# Patient Record
Sex: Male | Born: 1985 | Race: White | Hispanic: Yes | Marital: Single | Smoking: Never smoker
Health system: Southern US, Community
[De-identification: ages and names within clinical notes are randomized; demographics above are authoritative.]

---

## 2005-12-20 ENCOUNTER — Emergency Department: Payer: Self-pay | Admitting: Emergency Medicine

## 2009-05-12 ENCOUNTER — Emergency Department: Payer: Self-pay | Admitting: Emergency Medicine

## 2010-07-31 ENCOUNTER — Emergency Department: Payer: Self-pay | Admitting: Unknown Physician Specialty

## 2010-11-01 ENCOUNTER — Emergency Department: Payer: Self-pay | Admitting: *Deleted

## 2015-10-06 LAB — HM HIV SCREENING LAB: HM HIV Screening: NEGATIVE

## 2015-10-15 ENCOUNTER — Emergency Department
Admission: EM | Admit: 2015-10-15 | Discharge: 2015-10-15 | Disposition: A | Discharge status: Home / Self Care | Payer: Self-pay | Attending: Emergency Medicine | Admitting: Emergency Medicine

## 2015-10-15 ENCOUNTER — Encounter: Payer: Self-pay | Admitting: Emergency Medicine

## 2015-10-15 ENCOUNTER — Emergency Department: Payer: Self-pay

## 2015-10-15 DIAGNOSIS — Y999 Unspecified external cause status: Secondary | ICD-10-CM | POA: Insufficient documentation

## 2015-10-15 DIAGNOSIS — F1290 Cannabis use, unspecified, uncomplicated: Secondary | ICD-10-CM | POA: Insufficient documentation

## 2015-10-15 DIAGNOSIS — Y939 Activity, unspecified: Secondary | ICD-10-CM | POA: Insufficient documentation

## 2015-10-15 DIAGNOSIS — Y929 Unspecified place or not applicable: Secondary | ICD-10-CM | POA: Insufficient documentation

## 2015-10-15 DIAGNOSIS — G5621 Lesion of ulnar nerve, right upper limb: Secondary | ICD-10-CM | POA: Insufficient documentation

## 2015-10-15 DIAGNOSIS — W1800XA Striking against unspecified object with subsequent fall, initial encounter: Secondary | ICD-10-CM | POA: Insufficient documentation

## 2015-10-15 MED ORDER — PREDNISONE 20 MG PO TABS
60.0000 mg | ORAL_TABLET | Freq: Once | ORAL | Status: AC
  Administered 2015-10-15: 60 mg via ORAL
  Filled 2015-10-15: qty 3

## 2015-10-15 MED ORDER — PREDNISONE 20 MG PO TABS: 40.0000 mg | ORAL_TABLET | Freq: Every day | ORAL | 0 refills | Status: DC

## 2015-10-15 NOTE — ED Provider Notes (Signed)
[facility] Emergency Department Provider Note  Time seen: 1:13 PM  I have reviewed the triage vital signs and the nursing notes.   HISTORY  Chief Complaint Fall and Numbness    HPI Allen Murphy is a 30 y.o. male with no past medical history who presents the emergency department for evaluation of right hand numbness/weakness as well as a head injury with LOC. According to the patient 2 days ago he fell backwards hitting his right elbow and the back of his head. He states brief LOC at that time. Denies nausea or vomiting. Denies any headache currently however he states since that fall he has had weakness and numbness in his fourth and fifth digits of the right hand. Patient denies any pain in the hand. Continues to have moderate pain in the right elbow especially with flexion.  History reviewed. No pertinent past medical history.  There are no active problems to display for this patient.   History reviewed. No pertinent surgical history.  Prior to Admission medications   Not on File    No Known Allergies  No family history on file.  Social History Social History  Substance Use Topics  . Smoking status: Never Smoker  . Smokeless tobacco: Never Used  . Alcohol use Yes     Comment: Rare    Review of Systems Constitutional: Negative for fever Cardiovascular: Negative for chest pain. Respiratory: Negative for shortness of breath. Gastrointestinal: Negative for abdominal pain Musculoskeletal:Right elbow pain. Skin: Negative for rash. Neurological: Negative for . Weakness and numbness in the right fourth and fifth fingers. 10-point ROS otherwise negative.  ____________________________________________   PHYSICAL EXAM:  VITAL SIGNS: ED Triage Vitals  Enc Vitals Group     BP 10/15/15 1132 (!) 142/78     Pulse Rate 10/15/15 1132 81     Resp 10/15/15 1132 18     Temp 10/15/15 1132 98.4 F (36.9 C)     Temp Source 10/15/15 1132 Oral     SpO2  10/15/15 1132 100 %     Weight 10/15/15 1132 130 lb (59 kg)     Height 10/15/15 1132  (1.626 m)     Head Circumference --      Peak Flow --      Pain Score 10/15/15 1133 5     Pain Loc --      Pain Edu? --      Excl. in GC? --     Constitutional: Alert and oriented. Well appearing and in no distress. Eyes: Normal exam ENT   Head: Normocephalic and atraumatic.   Mouth/Throat: Mucous membranes are moist. Cardiovascular: Normal rate, regular rhythm. No murmur Respiratory: Normal respiratory effort without tachypnea nor retractions. Breath sounds are clear  Gastrointestinal: Soft and nontender. No distention.   Musculoskeletal: Moderate tenderness to palpation and mild edema noted in the right elbow especially the medial aspect. Very tender to palpation over the ulnar nerve in the elbow. No tenderness palpation of the wrist or hand. Neurologic:  Normal speech and language. Patient does have subjective decreased sensation in the right fourth and fifth digits with decreased ability to flex and extend the right fourth and fifth digits. Skin:  Skin is warm, dry and intact.  Psychiatric: Mood and affect are normal. Speech and behavior are normal.   ____________________________________________   RADIOLOGY  Elbow x-rays negative. CT scan of the head is negative.  ____________________________________________   INITIAL IMPRESSION / ASSESSMENT AND PLAN / ED COURSE  Pertinent labs &  imaging results that were available during my care of the patient were reviewed by me and considered in my medical decision making (see chart for details).  Patient presents emergency Department with continued numbness and weakness of the right fourth and fifth digits after a fall hitting his elbow and head 2 days ago. Patient CT scan the head is normal. 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 Given the edema and tenderness of the right elbow we will place the patient on a brief course of steroids, have him follow-up with orthopedics in 1 week for repeat evaluation. The patient is agreeable to plan.  ____________________________________________   FINAL CLINICAL IMPRESSION(S) / ED DIAGNOSES  Ulnar neuropathy    Minna Antis, MD 10/15/15 1316

## 2015-10-15 NOTE — ED Triage Notes (Signed)
Pt states fell on Friday and hit the back of his head, and his elbow. Pt states +LOC at this time. States that he now has numbness to his R hand for his ring and pinky fingers. Pt states grip strength is weak , weak grip strength noted to R hand. Pt is alert and oriented at this time. NAD noted.

## 2016-10-17 IMAGING — CT CT HEAD W/O CM
3 series · 15 of 45 positions shown, 18 images · non-contrast
Comparison: None.

CLINICAL DATA: 30-year-old male with right hand numbness and
weakness following fall, head injury and loss of consciousness 2
days ago. Initial encounter.

EXAM:
CT HEAD WITHOUT CONTRAST
TECHNIQUE: Contiguous axial images were obtained from the base of the skull
through the vertex without intravenous contrast.

[Series 2: head wo · axial · 0.39mm/px · z∈[-2,+113]mm · 9 of 28 slices shown, 12 images]
[im 3/28  brain]
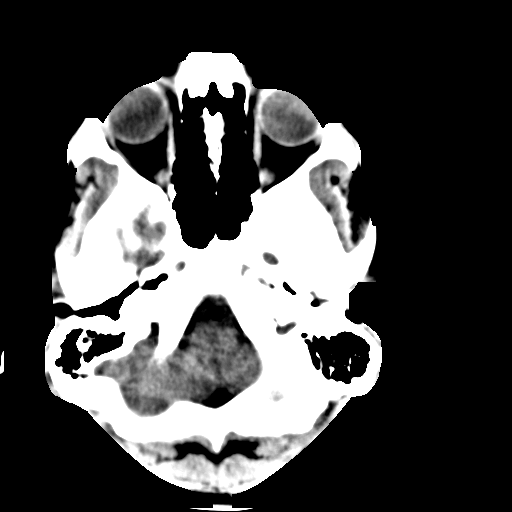
[im 3/28  bone]
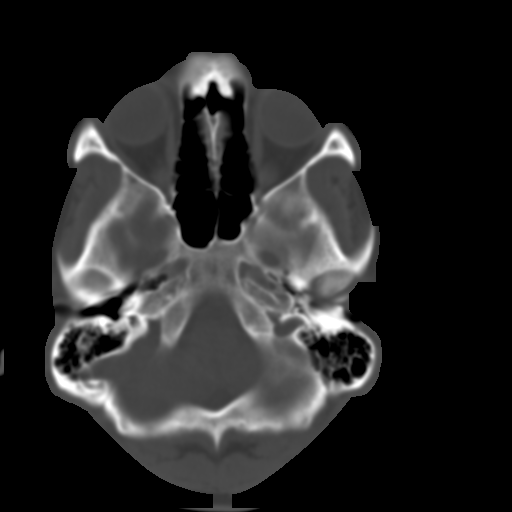
[im 6/28  brain]
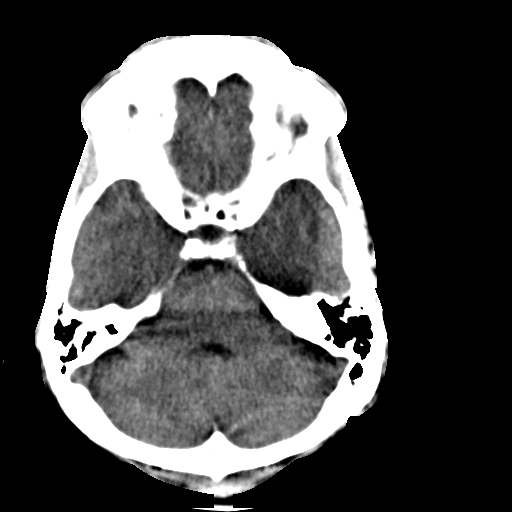
[im 9/28  brain]
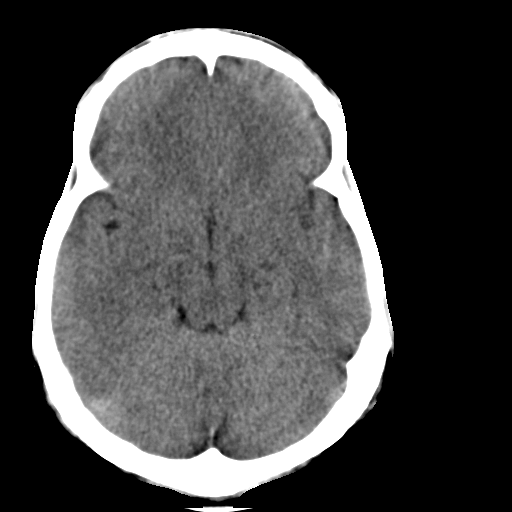
[im 12/28  brain]
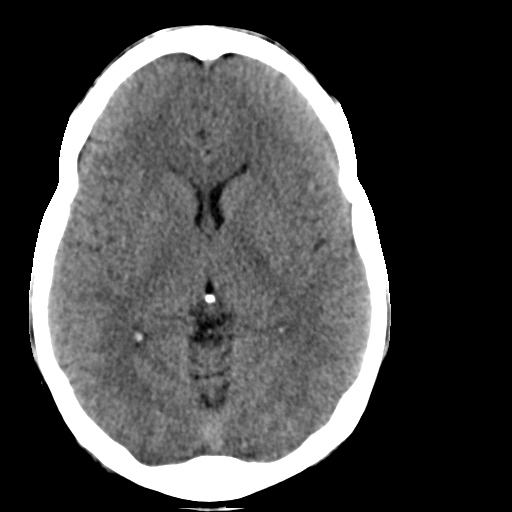
[im 15/28  brain]
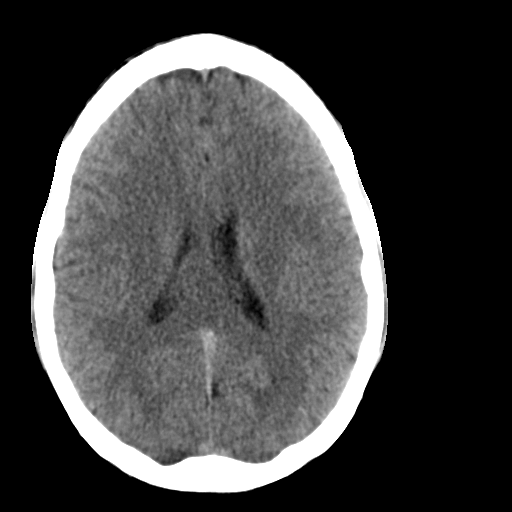
[im 15/28  bone]
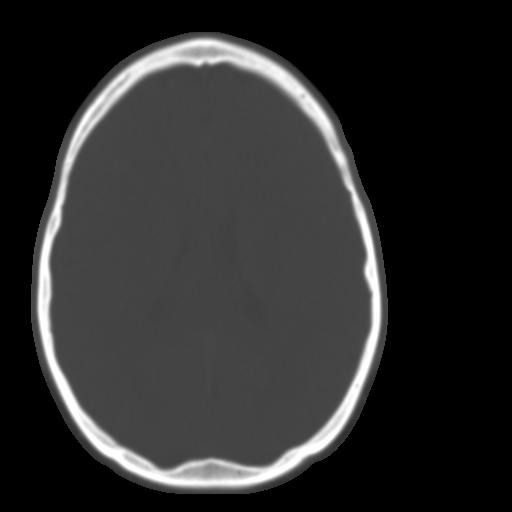
[im 17/28  brain]
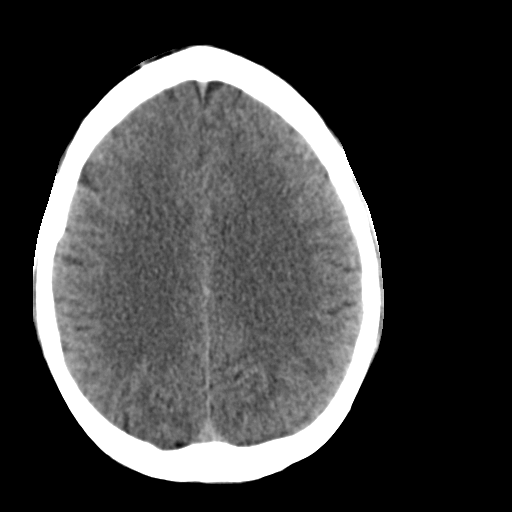
[im 20/28  brain]
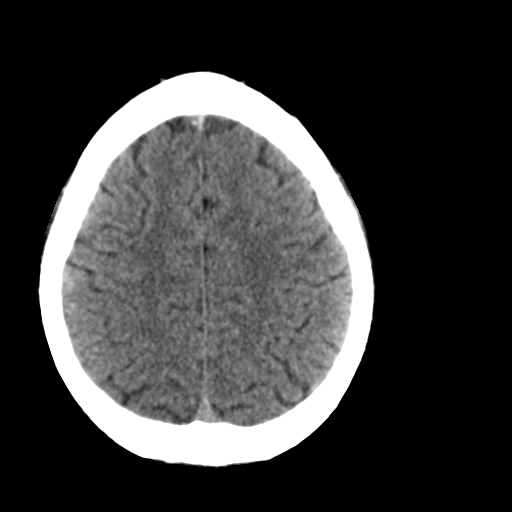
[im 23/28  brain]
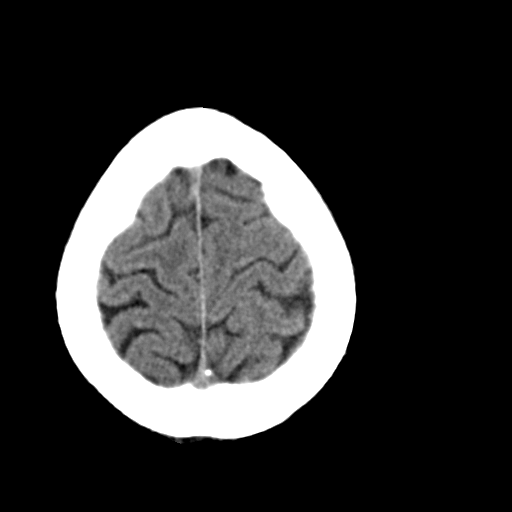
[im 26/28  brain]
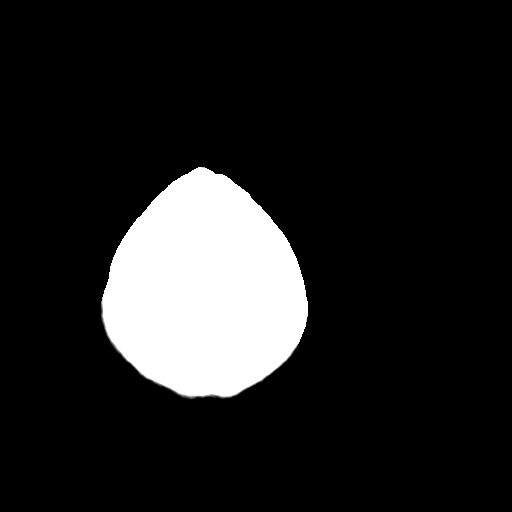
[im 26/28  bone]
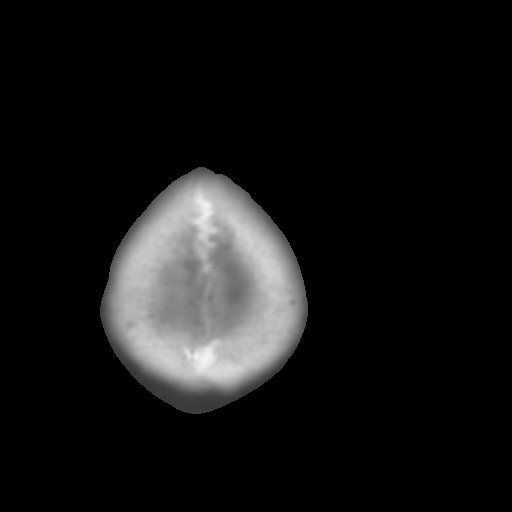

[Series 4: coronal soft tissue · coronal · 0.32mm/px · 3 of 62 slices shown]
[im 21/62  brain]
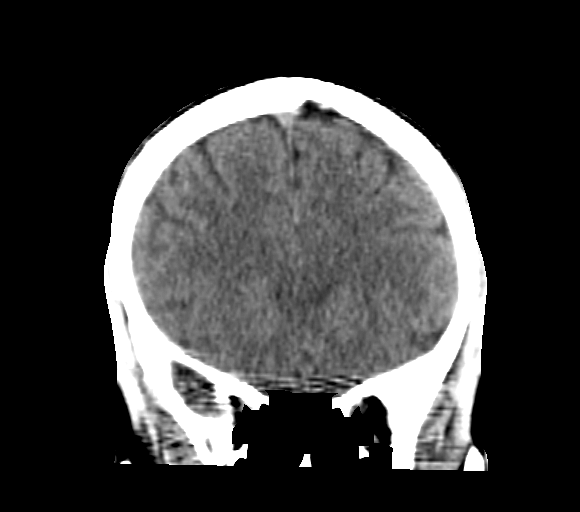
[im 28/62  brain]
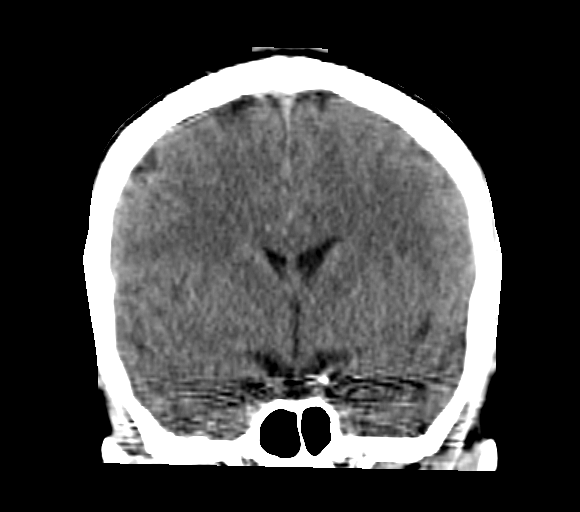
[im 34/62  brain]
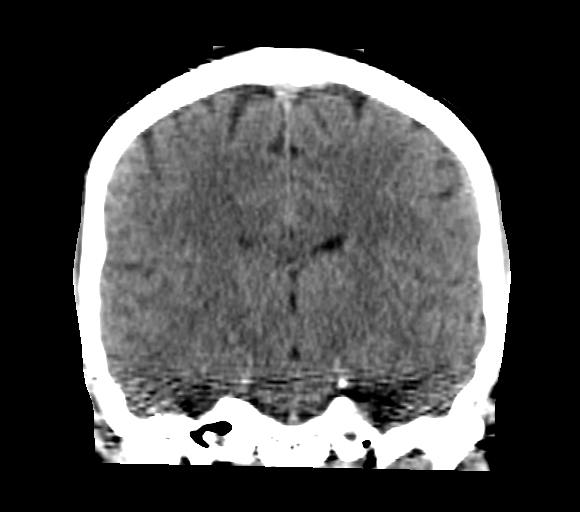

[Series 5: sagittal soft tissue · sagittal · 0.33mm/px · 3 of 52 slices shown]
[im 18/52  brain]
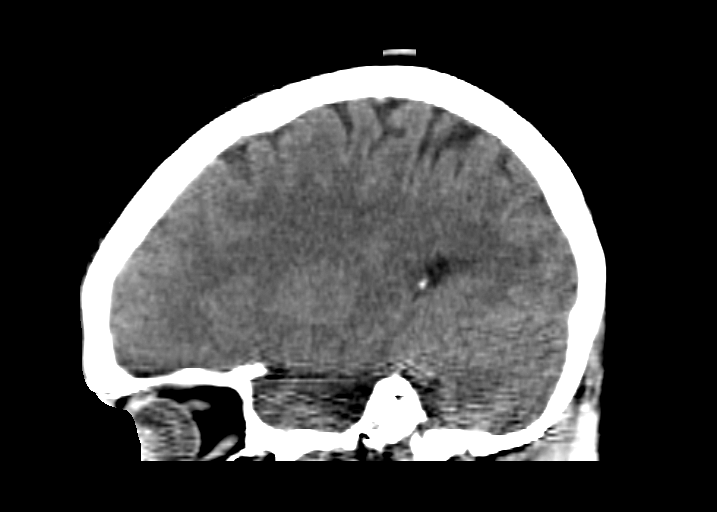
[im 26/52  brain]
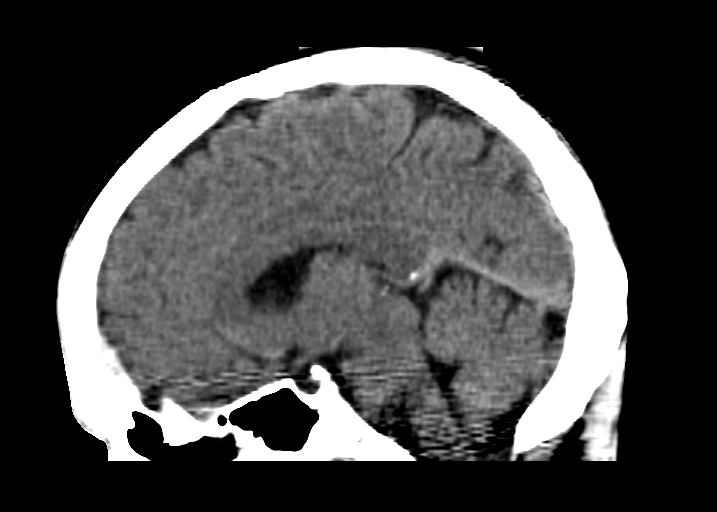
[im 35/52  brain]
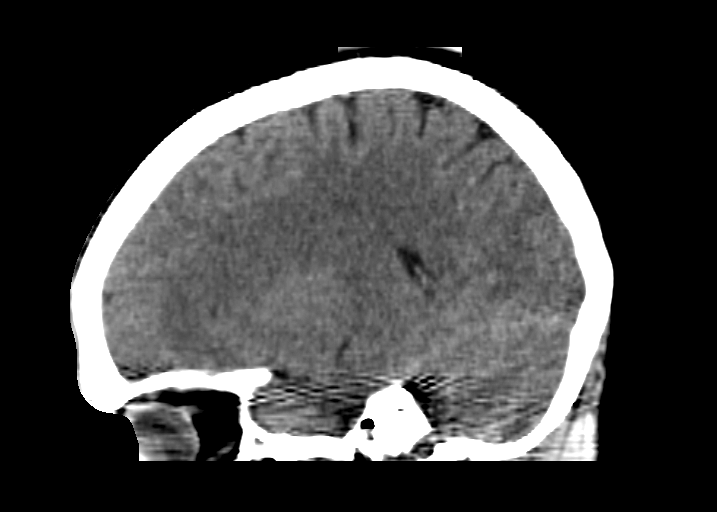

[15 of 45 positions shown; findings below may reference images not displayed]

FINDINGS: No intracranial abnormalities are identified, including mass lesion
or mass effect, hydrocephalus, extra-axial fluid collection, midline
shift, hemorrhage, or acute infarction.

The visualized bony calvarium is unremarkable.
IMPRESSION: Unremarkable noncontrast head CT.

## 2019-04-16 ENCOUNTER — Ambulatory Visit: Payer: Self-pay

## 2019-06-11 ENCOUNTER — Ambulatory Visit: Payer: Self-pay

## 2019-08-20 ENCOUNTER — Ambulatory Visit: Payer: Self-pay | Admitting: Physician Assistant

## 2019-08-20 ENCOUNTER — Encounter: Payer: Self-pay | Admitting: Physician Assistant

## 2019-08-20 ENCOUNTER — Other Ambulatory Visit: Payer: Self-pay

## 2019-08-20 DIAGNOSIS — Z113 Encounter for screening for infections with a predominantly sexual mode of transmission: Secondary | ICD-10-CM

## 2019-08-20 DIAGNOSIS — Z8619 Personal history of other infectious and parasitic diseases: Secondary | ICD-10-CM

## 2019-08-20 DIAGNOSIS — B356 Tinea cruris: Secondary | ICD-10-CM

## 2019-08-20 LAB — GRAM STAIN

## 2019-08-20 MED ORDER — CLOTRIMAZOLE 1 % EX CREA: 1.0000 "application " | TOPICAL_CREAM | Freq: Two times a day (BID) | CUTANEOUS | 0 refills | Status: AC

## 2019-08-20 NOTE — Progress Notes (Signed)
Pt here for STD screening. 

## 2019-08-20 NOTE — Progress Notes (Signed)
Gram stain reviewed and is negative today, so no treatment needed for gram stain per standing order. Pt received Clotrimazole Topical cream per provider order. Provider orders completed.

## 2019-08-21 DIAGNOSIS — Z8619 Personal history of other infectious and parasitic diseases: Secondary | ICD-10-CM | POA: Insufficient documentation

## 2019-08-21 NOTE — Progress Notes (Signed)
[facility] Department STI clinic/screening visit  Subjective:  Allen Murphy is a 34 y.o. male being seen today for an STI screening visit. The patient reports they do have symptoms.    Patient has the following medical conditions:  There are no problems to display for this patient.    Chief Complaint  Patient presents with  . SEXUALLY TRANSMITTED DISEASE    screening    HPI  Patient reports that he is having itching and skin "peeling" on upper inner thigh and on the head of his penis.  States that he has not used any OTC creams or salves on the area.  Denies chronic conditions, surgeries and regular medicines.  Per patient last HIV test was 2019.   See flowsheet for further details and programmatic requirements.    The following portions of the patient's history were reviewed and updated as appropriate: allergies, current medications, past medical history, past social history, past surgical history and problem list.  Objective:  There were no vitals filed for this visit.  Physical Exam Constitutional:      General: He is not in acute distress.    Appearance: Normal appearance.  HENT:     Head: Normocephalic and atraumatic.     Comments: No nits, lice, or hair loss. No cervical, supraclavicular or axillary adenopathy.    Mouth/Throat:     Mouth: Mucous membranes are moist.     Pharynx: Oropharynx is clear. No oropharyngeal exudate or posterior oropharyngeal erythema.  Eyes:     Conjunctiva/sclera: Conjunctivae normal.  Pulmonary:     Effort: Pulmonary effort is normal.  Abdominal:     Palpations: Abdomen is soft. There is no mass.     Tenderness: There is no abdominal tenderness. There is no guarding or rebound.  Genitourinary:    Penis: Normal.      Testes: Normal.     Comments: Pubic area without nits, lice, edema, erythema and lesions. Inguinal area without adenopathy but on L>R side, hyperpigmented, scaling area over slight pinkish base with signs  of excoriation. Penis uncircumcised, without ulcerative lesions and discharge from meatus.  Head of penis with slight grayish scaling over pinkish petechiae. Musculoskeletal:     Cervical back: Neck supple. No tenderness.  Skin:    General: Skin is warm and dry.     Findings: No bruising, erythema, lesion or rash.  Neurological:     Mental Status: He is alert and oriented to person, place, and time.  Psychiatric:        Mood and Affect: Mood normal.        Behavior: Behavior normal.        Thought Content: Thought content normal.        Judgment: Judgment normal.       Assessment and Plan:  Allen Murphy is a 34 y.o. male presenting to the [facility] Department for STI screening  1. Screening for STD (sexually transmitted disease) Patient into clinic with symptoms.  Declines HIV testing today. Rec condoms with all sex. Await test results.  Counseled that RN will call if needs to RTC for further treatment once results are back.  - Gram stain - Gonococcus culture - Syphilis Serology, [facility]  2. Tinea cruris Counseled patient on steps to reduce moisture in inguinal/pubic area to prevent fungal infections. Use Clotrimazole 1% cream BID for 14 days and if needed can get OTC supply to finish treatment. - clotrimazole (CLOTRIMAZOLE ANTI-FUNGAL) 1 % cream; Apply 1 application  topically 2 (two) times daily for 14 days.  Dispense: 30 g; Refill: 0     Return if symptoms worsen or fail to improve.  No future appointments.  Jerene Dilling, PA

## 2019-08-25 LAB — GONOCOCCUS CULTURE

## 2021-11-11 ENCOUNTER — Encounter: Payer: Self-pay | Admitting: Intensive Care

## 2021-11-11 ENCOUNTER — Emergency Department
Admission: EM | Admit: 2021-11-11 | Discharge: 2021-11-11 | Disposition: A | Discharge status: Home / Self Care | Payer: Self-pay | Attending: Emergency Medicine | Admitting: Emergency Medicine

## 2021-11-11 ENCOUNTER — Ambulatory Visit
Admission: EM | Admit: 2021-11-11 | Discharge: 2021-11-11 | Disposition: A | Discharge status: Home / Self Care | Payer: Self-pay | Attending: Emergency Medicine | Admitting: Emergency Medicine

## 2021-11-11 ENCOUNTER — Encounter: Payer: Self-pay | Admitting: Emergency Medicine

## 2021-11-11 ENCOUNTER — Emergency Department: Payer: Self-pay

## 2021-11-11 ENCOUNTER — Other Ambulatory Visit: Payer: Self-pay

## 2021-11-11 DIAGNOSIS — N451 Epididymitis: Secondary | ICD-10-CM | POA: Insufficient documentation

## 2021-11-11 DIAGNOSIS — R1031 Right lower quadrant pain: Secondary | ICD-10-CM | POA: Insufficient documentation

## 2021-11-11 DIAGNOSIS — N50811 Right testicular pain: Secondary | ICD-10-CM | POA: Insufficient documentation

## 2021-11-11 LAB — URINALYSIS, MICROSCOPIC (REFLEX): Squamous Epithelial / HPF: NONE SEEN (ref 0–5)

## 2021-11-11 LAB — CBC
HCT: 43.5 % (ref 39.0–52.0)
Hemoglobin: 13.6 g/dL (ref 13.0–17.0)
MCH: 28.6 pg (ref 26.0–34.0)
MCHC: 31.3 g/dL (ref 30.0–36.0)
MCV: 91.4 fL (ref 80.0–100.0)
Platelets: 316 10*3/uL (ref 150–400)
RBC: 4.76 MIL/uL (ref 4.22–5.81)
RDW: 11.5 % (ref 11.5–15.5)
WBC: 8.9 10*3/uL (ref 4.0–10.5)
nRBC: 0 % (ref 0.0–0.2)

## 2021-11-11 LAB — COMPREHENSIVE METABOLIC PANEL
ALT: 25 U/L (ref 0–44)
AST: 23 U/L (ref 15–41)
Albumin: 4.3 g/dL (ref 3.5–5.0)
Alkaline Phosphatase: 73 U/L (ref 38–126)
Anion gap: 5 (ref 5–15)
BUN: 18 mg/dL (ref 6–20)
CO2: 28 mmol/L (ref 22–32)
Calcium: 9.2 mg/dL (ref 8.9–10.3)
Chloride: 106 mmol/L (ref 98–111)
Creatinine, Ser: 0.81 mg/dL (ref 0.61–1.24)
GFR, Estimated: >60 mL/min (ref >60)
Glucose, Bld: 105 mg/dL — ABNORMAL HIGH (ref 70–99)
Potassium: 4.4 mmol/L (ref 3.5–5.1)
Sodium: 139 mmol/L (ref 135–145)
Total Bilirubin: 0.8 mg/dL (ref 0.3–1.2)
Total Protein: 7.4 g/dL (ref 6.5–8.1)

## 2021-11-11 LAB — URINALYSIS, ROUTINE W REFLEX MICROSCOPIC
Bilirubin Urine: NEGATIVE
Bilirubin Urine: NEGATIVE
Glucose, UA: NEGATIVE mg/dL
Glucose, UA: NEGATIVE mg/dL
Ketones, ur: NEGATIVE mg/dL
Ketones, ur: 5 mg/dL — AB
Leukocytes,Ua: NEGATIVE
Leukocytes,Ua: NEGATIVE
Nitrite: NEGATIVE
Nitrite: NEGATIVE
Protein, ur: NEGATIVE mg/dL
Specific Gravity, Urine: 1.025 (ref 1.005–1.030)
Specific Gravity, Urine: 1.02 (ref 1.005–1.030)
pH: 6.5 (ref 5.0–8.0)
pH: 6 (ref 5.0–8.0)

## 2021-11-11 LAB — LIPASE, BLOOD: Lipase: 27 U/L (ref 11–51)

## 2021-11-11 MED ORDER — CEFTRIAXONE SODIUM 1 G IJ SOLR
500.0000 mg | Freq: Once | INTRAMUSCULAR | Status: AC
  Administered 2021-11-11: 500 mg via INTRAMUSCULAR
  Filled 2021-11-11: qty 10

## 2021-11-11 MED ORDER — DOXYCYCLINE HYCLATE 100 MG PO TABS: 100.0000 mg | ORAL_TABLET | Freq: Two times a day (BID) | ORAL | 0 refills | Status: AC

## 2021-11-11 NOTE — ED Triage Notes (Signed)
Patient reports abdominal pain that starts at his belly button and goes dow his abdomen that started last Sunday.  Patient denies N/V/D.  Patient reports urinary frequency.

## 2021-11-11 NOTE — ED Provider Notes (Signed)
[facility]    CSN: [id number] Arrival date & time: 11/11/21  1331      History   Chief Complaint Chief Complaint  Patient presents with   Abdominal Pain    HPI Allen Murphy is a 36 y.o. male.   HPI  36 year old male here for evaluation of abdominal complaints.  Patient reports that for the last week he has been experiencing pain on the right lower portion of his abdomen.  He is also been experiencing pain in his right testicle and states that it is swollen and higher than it typically is.  He states he feels like it shifting around in side of his scrotum.  This is associated with discomfort with urination which she describes as an uncomfortable pressure and urinary urgency and frequency.  He denies any fever, nausea, or vomiting.  Patient is walking with a limp secondary to pain in his right testicle.  History reviewed. No pertinent past medical history.  Patient Active Problem List   Diagnosis Date Noted   History of syphilis 08/21/2019    History reviewed. No pertinent surgical history.     Home Medications    Prior to Admission medications   Medication Sig Start Date End Date Taking? Authorizing Provider  predniSONE (DELTASONE) 20 MG tablet Take 2 tablets (40 mg total) by mouth daily. 10/15/15   Minna Antis, MD    Family History History reviewed. No pertinent family history.  Social History Social History   Tobacco Use   Smoking status: Never   Smokeless tobacco: Never  Vaping Use   Vaping Use: Some days  Substance Use Topics   Alcohol use: Yes    Comment: Rare   Drug use: Yes    Types: Marijuana    Comment: 1-2 times/day     Allergies   Patient has no known allergies.   Review of Systems Review of Systems  Constitutional:  Negative for fever.  Gastrointestinal:  Positive for abdominal pain. Negative for nausea and vomiting.  Genitourinary:  Positive for dysuria, frequency, testicular pain and urgency.  Skin:  Negative for  rash.  Hematological: Negative.   Psychiatric/Behavioral: Negative.       Physical Exam Triage Vital Signs ED Triage Vitals  Enc Vitals Group     BP 11/11/21 1339 (!) 141/88     Pulse Rate 11/11/21 1339 86     Resp 11/11/21 1339 15     Temp 11/11/21 1339 98.2 F (36.8 C)     Temp Source 11/11/21 1339 Oral     SpO2 11/11/21 1339 98 %     Weight 11/11/21 1337 140 lb (63.5 kg)     Height 11/11/21 1337 5\' 4"  (1.626 m)     Head Circumference --      Peak Flow --      Pain Score 11/11/21 1337 5     Pain Loc --      Pain Edu? --      Excl. in GC? --    No data found.  Updated Vital Signs BP (!) 141/88 (BP Location: Left Arm)   Pulse 86   Temp 98.2 F (36.8 C) (Oral)   Resp 15   Ht 5\' 4"  (1.626 m)   Wt 140 lb (63.5 kg)   SpO2 98%   BMI 24.03 kg/m   Visual Acuity Right Eye Distance:   Left Eye Distance:   Bilateral Distance:    Right Eye Near:   Left Eye Near:    Bilateral Near:  Physical Exam Vitals and nursing note reviewed.  Constitutional:      General: He is in acute distress.     Appearance: Normal appearance.  HENT:     Head: Normocephalic and atraumatic.  Cardiovascular:     Rate and Rhythm: Normal rate and regular rhythm.     Pulses: Normal pulses.     Heart sounds: Normal heart sounds. No murmur heard.    No friction rub. No gallop.  Pulmonary:     Effort: Pulmonary effort is normal.     Breath sounds: Normal breath sounds. No wheezing, rhonchi or rales.  Abdominal:     General: Abdomen is flat.     Palpations: Abdomen is soft.     Tenderness: There is abdominal tenderness. There is guarding. There is no right CVA tenderness, left CVA tenderness or rebound.  Genitourinary:    Penis: Normal.   Neurological:     Mental Status: He is alert.      UC Treatments / Results  Labs (all labs ordered are listed, but only abnormal results are displayed) Labs Reviewed  URINALYSIS, ROUTINE W REFLEX MICROSCOPIC - Abnormal; Notable for the following  components:      Result Value   Hgb urine dipstick TRACE (*)    Protein, ur TRACE (*)    All other components within normal limits  URINALYSIS, MICROSCOPIC (REFLEX) - Abnormal; Notable for the following components:   Bacteria, UA RARE (*)    All other components within normal limits    EKG   Radiology No results found.  Procedures Procedures (including critical care time)  Medications Ordered in UC Medications - No data to display  Initial Impression / Assessment and Plan / UC Course  I have reviewed the triage vital signs and the nursing notes.  Pertinent labs & imaging results that were available during my care of the patient were reviewed by me and considered in my medical decision making (see chart for details).   Patient is a 36 year old male in a moderate degree of pain here for evaluation of pain, swelling, and elevation of his right testicle, right lower quadrant abdominal pain, and urinary urgency, frequency, and pain.  He denies burning with urination but states that the pressure is uncomfortable when he urinates.  All of his symptoms began 1 week ago.  He states he read online that masturbation may help his symptoms so he tried that with mild relief but then his symptoms returned.  He has not had any fever, nausea, or vomiting.  He is walking with a limp secondary to the discomfort in his right testicle.  On exam patient has S1-S2 heart sounds with regular rate and rhythm and lung sounds that are clear to auscultation in all fields.  No CVA tenderness on exam.  Abdomen is soft and flat with right lower quadrant tenderness and guarding.  Genital exam reveals an edematous and tender right testicle.  The testicle does appear to be elevated closer to the perineal wall than the left.  Patient states that he noticed that and that is concerning for him.  He does state that he feels like his testicle shifting around inside of his scrotum.  There is also some swelling to the inguinal  region of the groin without any definitive hernia being palpated.  There is no indirect hernia on exam.  The left testicle is unremarkable.  There is no discharge from the urethral meatus and there is no lesions or rashes on the shaft of the  penis.  Patient's exam is concerning for possible appendicitis as well as possible testicular torsion.  Lower on the differential is a urinary tract infection or epididymitis.  Advised the patient that he needs an ultrasound of his scrotum and possibly CT scan of his abdomen to rule out the 2 most concerning possibilities which are appendicitis and testicular torsion.  He has elected to go to [facility] ER via POV.  Urinalysis was collected at triage which shows trace hemoglobin and trace protein but is negative for leukocyte Estrace or nitrites.  Reflex micro shows rare bacteria.   Final Clinical Impressions(s) / UC Diagnoses   Final diagnoses:  Right lower quadrant abdominal pain  Pain in right testicle     Discharge Instructions      As we discussed, your exam is concerning for possible testicular torsion on the right as well as possible appendicitis.  Please go to [facility] ER for evaluation of your symptoms.  Please go now.     ED Prescriptions   None    PDMP not reviewed this encounter.   Becky Augusta, NP 11/11/21 1357

## 2021-11-11 NOTE — Discharge Instructions (Addendum)
As we discussed, your exam is concerning for possible testicular torsion on the right as well as possible appendicitis.  Please go to [facility] ER for evaluation of your symptoms.  Please go now.

## 2021-11-11 NOTE — ED Triage Notes (Signed)
Patient c/o abdomen pain that radiates down to right testicle. Swelling in right testicle. Symptoms started last Sunday 11/04/21. Denies N/V/D. Reports sharp and painful when urinating

## 2021-11-11 NOTE — ED Provider Notes (Signed)
[facility] Provider Note  Patient Contact: 9:20 PM (approximate)   History   Abdominal Pain and Groin Swelling   HPI  Allen Murphy is a 36 y.o. male who presents the emergency department complaining of testicular pain.  Patient states that he had some right abdominal pain earlier this week, it is now moved into the testicles.  Patient denies dysuria, polyuria, hematuria.  No flank pain.  No history of nephrolithiasis.  He has had unprotected sex in the last 3 months but is not concerned for STD.  Patient denies any fevers or chills, nausea vomiting, diarrhea or constipation.     Physical Exam   Triage Vital Signs: ED Triage Vitals  Enc Vitals Group     BP 11/11/21 2059 (!) 150/92     Pulse Rate 11/11/21 2059 77     Resp 11/11/21 2059 18     Temp 11/11/21 2059 98.9 F (37.2 C)     Temp Source 11/11/21 2059 Oral     SpO2 11/11/21 2059 97 %     Weight 11/11/21 1538 140 lb (63.5 kg)     Height 11/11/21 1538 5\' 5"  (1.651 m)     Head Circumference --      Peak Flow --      Pain Score 11/11/21 1538 6     Pain Loc --      Pain Edu? --      Excl. in GC? --     Most recent vital signs: Vitals:   11/11/21 2059  BP: (!) 150/92  Pulse: 77  Resp: 18  Temp: 98.9 F (37.2 C)  SpO2: 97%     General: Alert and in no acute distress.  Cardiovascular:  Good peripheral perfusion Respiratory: Normal respiratory effort without tachypnea or retractions. Lungs CTAB.  Gastrointestinal: Bowel sounds 4 quadrants. Soft and nontender to palpation. No guarding or rigidity. No palpable masses. No distention. No CVA tenderness. Musculoskeletal: Full range of motion to all extremities.  Neurologic:  No gross focal neurologic deficits are appreciated.  Skin:   No rash noted Other:   ED Results / Procedures / Treatments   Labs (all labs ordered are listed, but only abnormal results are displayed) Labs Reviewed  COMPREHENSIVE METABOLIC PANEL - Abnormal; Notable  for the following components:      Result Value   Glucose, Bld 105 (*)    All other components within normal limits  URINALYSIS, ROUTINE W REFLEX MICROSCOPIC - Abnormal; Notable for the following components:   Color, Urine YELLOW (*)    APPearance CLEAR (*)    Hgb urine dipstick SMALL (*)    Ketones, ur 5 (*)    Bacteria, UA RARE (*)    All other components within normal limits  LIPASE, BLOOD  CBC     EKG     RADIOLOGY  I personally viewed, evaluated, and interpreted these images as part of my medical decision making, as well as reviewing the written report by the radiologist.  ED Provider Interpretation: Findings consistent with epididymal cyst.  No torsion  11/13/21 SCROTUM DOPPLER  Result Date: 11/11/2021 CLINICAL DATA:  Bilateral swelling for 1 week EXAM: SCROTAL ULTRASOUND DOPPLER ULTRASOUND OF THE TESTICLES TECHNIQUE: Complete ultrasound examination of the testicles, epididymis, and other scrotal structures was performed. Color and spectral Doppler ultrasound were also utilized to evaluate blood flow to the testicles. COMPARISON:  None Available. FINDINGS: Right testicle Measurements: 4.1 x 2.0 x 3.3 cm. No mass or microlithiasis visualized. Left testicle  Measurements: 5.1 x 2.1 x 3.3 cm. No mass or microlithiasis visualized. Right epididymis:  Normal in size and appearance. Left epididymis: There is a 2.7 x 1.6 x 2.6 cm simple cyst in the region of the left epididymis. Hydrocele:  None visualized. Varicocele:  None visualized. Pulsed Doppler interrogation of both testes demonstrates normal low resistance arterial and venous waveforms bilaterally. IMPRESSION: 1. No evidence for torsion. 2. 2.7 cm simple left epididymal cyst. Electronically Signed   By: Darliss Cheney M.D.   On: 11/11/2021 16:46    PROCEDURES:  Critical Care performed: No  Procedures   MEDICATIONS ORDERED IN ED: Medications  cefTRIAXone (ROCEPHIN) injection 500 mg (has no administration in time range)      IMPRESSION / MDM / ASSESSMENT AND PLAN / ED COURSE  I reviewed the triage vital signs and the nursing notes.                              Differential diagnosis includes, but is not limited to, epididymitis, gonorrhea, chlamydia, UTI, prostatitis, appendicitis, colitis, diverticulitis   Patient's presentation is most consistent with acute presentation with potential threat to life or bodily function.   Patient's diagnosis is consistent with epididymitis.  Patient presented to the emergency department with abdominal pain at the start of the week, transition into his testicles.  Denies dysuria, polyuria, hematuria.  Patient declined gonorrhea and Chlamydia testing at this time.  Patient does have epididymal cyst which is consistent with epididymitis which matches the patient's symptoms.  At this time I will treat the patient with Rocephin, doxycycline.  If patient develops other symptoms, worsening pain, if the pain returns back to his abdomen he may always return to the ED..  Follow-up primary care as needed.  Patient is given ED precautions to return to the ED for any worsening or new symptoms.        FINAL CLINICAL IMPRESSION(S) / ED DIAGNOSES   Final diagnoses:  Epididymitis     Rx / DC Orders   ED Discharge Orders          Ordered    doxycycline (VIBRA-TABS) 100 MG tablet  2 times daily        11/11/21 2124             Note:  This document was prepared using Dragon voice recognition software and may include unintentional dictation errors.   Lanette Hampshire 11/11/21 2125    Pilar Jarvis, MD 11/12/21 1515

## 2021-11-11 NOTE — ED Notes (Signed)
Patient is being discharged from the Urgent Care and sent to the [facility] Emergency Department via private vehicle . Per Becky Augusta, NP, patient is in need of higher level of care due to needing [place]. Patient is aware and verbalizes understanding of plan of care.  Vitals:   11/11/21 1339  BP: (!) 141/88  Pulse: 86  Resp: 15  Temp: 98.2 F (36.8 C)  SpO2: 98%

## 2021-12-15 ENCOUNTER — Telehealth: Payer: Self-pay | Admitting: Family

## 2021-12-15 DIAGNOSIS — R109 Unspecified abdominal pain: Secondary | ICD-10-CM

## 2021-12-16 NOTE — Progress Notes (Signed)
Given your symptoms, you need to be tested for STDS to rule out. The doxycycline treats some, but not all. And since the pain returned  I feel your condition warrants further evaluation and I recommend that you be seen in a face to face visit.   NOTE: There will be NO CHARGE for this eVisit   If you are having a true medical emergency please call 911.      For an urgent face to face visit, [place] [facility] has seven urgent care centers for your convenience:     [facility] [place] [facility] [place] Get Driving Directions [phone] [place], [place] [address]    [facility] [place] [facility]) Get Driving Directions [phone] [place], [place] [address]  [facility] [place] [facility]) Get Driving Directions [phone] [address] [place],  [place]  [address]  [facility] [place] [facility] - at [address] Get Driving Directions  [phone] [address] [place],  [place] [address]   [facility] Get Driving Directions [phone] [place], [place], [place] [address]   [facility] Get Driving Directions  [phone] [address], [place] [address]   [facility] [place] Get Driving Directions [phone] [address] [place], [place] [address]  Your MyChart E-visit questionnaire answers were reviewed by a board certified advanced clinical practitioner to complete your personal care plan based on your specific symptoms.  Thank you for using e-Visits.
